# Patient Record
Sex: Female | Born: 2000 | Hispanic: Yes | Marital: Single | Smoking: Never smoker
Health system: Southern US, Community
[De-identification: ages and names within clinical notes are randomized; demographics above are authoritative.]

## PROBLEM LIST (undated history)

## (undated) DIAGNOSIS — J45909 Unspecified asthma, uncomplicated: Secondary | ICD-10-CM

---
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
## 2019-04-18 DIAGNOSIS — F419 Anxiety disorder, unspecified: Secondary | ICD-10-CM

## 2019-04-19 ENCOUNTER — Other Ambulatory Visit: Payer: Self-pay

## 2019-04-19 ENCOUNTER — Ambulatory Visit (LOCAL_COMMUNITY_HEALTH_CENTER): Payer: Self-pay | Admitting: Advanced Practice Midwife

## 2019-04-19 ENCOUNTER — Encounter: Payer: Self-pay | Admitting: Advanced Practice Midwife

## 2019-04-19 VITALS — BP 99/63 | Ht 60.0 in | Wt 149.0 lb

## 2019-04-19 DIAGNOSIS — Z3009 Encounter for other general counseling and advice on contraception: Secondary | ICD-10-CM

## 2019-04-19 DIAGNOSIS — Z30013 Encounter for initial prescription of injectable contraceptive: Secondary | ICD-10-CM

## 2019-04-19 LAB — HEMOGLOBIN, FINGERSTICK: Hemoglobin: 12.6 g/dL (ref 11.1–15.9)

## 2019-04-19 LAB — WET PREP FOR TRICH, YEAST, CLUE
Trichomonas Exam: NEGATIVE
Yeast Exam: NEGATIVE

## 2019-04-19 NOTE — Progress Notes (Signed)
   [place] problem visit  [place] Department  Subjective:  Colleen Gomez is a 18 y.o. HF G1P1  being seen today for menses 2x/mo with heavy bleeding (7-8 super pads/day) since Nexplanon removed   Chief Complaint  Patient presents with  . Menstrual Problem    heavy periods twice monthly    HPI  18 yo HF G104fP1 c/o heavy menses 2x/mo since Nexplanon removed 12/2017.  Began ocp's and only took for 2 months and self dc'd end of September due to no relief and c/o daily nausea, dizziness, and chills and sweating every night.  Onset menarche age 51 with heavy menses first 4 months and then normal.  No bleeding with Nexplanon.  Doesn't want Nexplanon again or ocp's.  Wants to try DMPA to stop bleeding because doesn't have Medicaid/insurance so doesn't want to go to GYN for this.  38 yo boyfriend of 2 years has vasectomy.  Last sex 04/17/19 without condom.  No bleeding today. Does the patient have a current or past history of drug use? No   No components found for: HCV]   Health Maintenance Due  Topic Date Due  . CHLAMYDIA SCREENING  08/16/2014  . HIV Screening  08/16/2014  . TETANUS/TDAP  08/16/2018  . INFLUENZA VACCINE  01/12/2019    ROS  The following portions of the patient's history were reviewed and updated as appropriate: allergies, current medications, past family history, past medical history, past social history, past surgical history and problem list. Problem list updated.   See flowsheet for other program required questions.  Objective:   Vitals:   04/19/19 1033  BP: 99/63  Weight: 149 lb (67.6 kg)  Height: 5' (1.524 m)    Physical Exam Constitutional:      Appearance: Normal appearance. She is normal weight.  Eyes:     Conjunctiva/sclera: Conjunctivae normal.  Abdominal:     Palpations: Abdomen is soft.  Genitourinary:    General: Normal vulva.     Exam position: Lithotomy position.     Labia:        Right: No  lesion.        Left: No lesion.      Vagina: Vaginal discharge (small amt white creamy leukorrhea, ph<4.5) present.     Cervix: Normal.     Uterus: Normal.      Adnexa: Right adnexa normal and left adnexa normal.     Rectum: Normal.  Skin:    General: Skin is warm and dry.  Neurological:     Mental Status: She is alert.       Assessment and Plan:  Colleen Gomez is a 18 y.o. female presenting to the [facility] Department for a Women's Health problem visit  1. Family planning To check Hgb for symptomatic possible anemia Pt declines GYN referral today and wants to try DMPA first - Hemoglobin, venipuncture - WET PREP FOR [place], YEAST, [place]  2. Encounter for initial prescription of injectable contraceptive DMPA 150 mg IM q 11-13 wks x 3 Please counsel on need for abstinance/back up condoms next  7 days (states boyfriend had vasectomy) If bleeding still heavy after 3 DMPA then needs GYN referral  When RN went in to give DMPA, pt states she changed her mind and doesn't want it but will go to GYN for consult   Return for 11-13 wk DMPA.  No future appointments.  Herbie Saxon, CNM

## 2019-04-19 NOTE — Progress Notes (Signed)
Hgb and wet mount reviewed, no tx per standing order. Pt states she changed her mind and declines DMPA today. Pt states she will schedule appt with OBGYN for evaluation of bleeding issue. Declined condoms. Provider orders addressed.

## 2019-04-19 NOTE — Progress Notes (Signed)
Pt here as she is having issues with her periods. Reports this has been going on for a year now ever since she had the Nexplanon removed. Reports she is having period twice a month that are heavy. Reports going through 7 or 8 super pads a day. Pt also reports nausea, chills, sweating at night, and dizziness. Pt reports she is not currently on birth control, pt reports she stopped taking birth control pills around the end of Sept., beginning of Oct. 2020 as they did not help regulate her periods.Ronny Bacon, RN

## 2019-06-14 NOTE — L&D Delivery Note (Signed)
-------------------------------------------------------------------------------   Attestation with edits by Zahn, Katelin Marie, MD at 06/03/20 0236 I was present for this uncomplicated vaginal delivery.  Katelin Zahn, MD Division of General Obstetrics and Gynecology  -------------------------------------------------------------------------------  OBSTETRICS DELIVERY NOTE   Pre-Operative Diagnosis: 1. [redacted]w[redacted]d pregnancy 2. IDA 3. Asthma   Post-Operative Diagnosis: Same; delivered  Delivery Type: Vaginal, Spontaneous  Delivery Clinician: MARLAND KALATA  Delivery Anesthesia: Epidural  Labor Complications: None   Labor Course: Patient was admitted for Spontaneous labor with normal labor course. She delivered NSVD.  Delivery Note: The OB team was called to see the patient because she was complete and pushing.  The infant's head was delivered. There was no nuchal cord. The shoulders and body followed with ease. There was no meconium present. Cord clamping was delayed 30 seconds, and then the cord was clamped and cut. The infant was placed on the maternal abdomen. The placenta was delivered as described below. The fundus was firm with IV pitocin and fundal massage.   The sponge, lap, and needle counts were correct and verified with the nurse in attendance.  Perineal Lacerations: None  Other Lacerations: none  Quantitative Blood Loss:    Findings: 1) female infant, Apgar scores of    at 1 minute and    at 5 minutes and birthweight pending per protocol   Placenta: Appearance: Intact  Removal: Spontaneous    Disposition: Discarded   Blood gases sent? no  Delivery Complications: None  Neonatal Disposition:  Infant to Maternal skin-to-skin.   Pregnancy risk: The risk of the pregnancy was high.  Attending Attestation: On arrival to the room, the patient had already delivered the infant.  I was present afterwards.

## 2020-02-04 ENCOUNTER — Encounter: Payer: Self-pay | Admitting: Emergency Medicine

## 2020-02-04 ENCOUNTER — Other Ambulatory Visit: Payer: Self-pay

## 2020-02-04 DIAGNOSIS — O26892 Other specified pregnancy related conditions, second trimester: Secondary | ICD-10-CM | POA: Insufficient documentation

## 2020-02-04 DIAGNOSIS — R0602 Shortness of breath: Secondary | ICD-10-CM | POA: Insufficient documentation

## 2020-02-04 DIAGNOSIS — Z3A22 22 weeks gestation of pregnancy: Secondary | ICD-10-CM | POA: Insufficient documentation

## 2020-02-04 DIAGNOSIS — Z5321 Procedure and treatment not carried out due to patient leaving prior to being seen by health care provider: Secondary | ICD-10-CM | POA: Insufficient documentation

## 2020-02-04 DIAGNOSIS — R0789 Other chest pain: Secondary | ICD-10-CM | POA: Insufficient documentation

## 2020-02-04 LAB — CBC WITH DIFFERENTIAL/PLATELET
Abs Immature Granulocytes: 0.06 10*3/uL (ref 0.00–0.07)
Basophils Absolute: 0.1 10*3/uL (ref 0.0–0.1)
Basophils Relative: 1 %
Eosinophils Absolute: 4.4 10*3/uL — ABNORMAL HIGH (ref 0.0–0.5)
Eosinophils Relative: 33 %
HCT: 31.9 % — ABNORMAL LOW (ref 36.0–46.0)
Hemoglobin: 10.7 g/dL — ABNORMAL LOW (ref 12.0–15.0)
Immature Granulocytes: 0 %
Lymphocytes Relative: 16 %
Lymphs Abs: 2.2 10*3/uL (ref 0.7–4.0)
MCH: 28.5 pg (ref 26.0–34.0)
MCHC: 33.5 g/dL (ref 30.0–36.0)
MCV: 85.1 fL (ref 80.0–100.0)
Monocytes Absolute: 0.6 10*3/uL (ref 0.1–1.0)
Monocytes Relative: 4 %
Neutro Abs: 6.1 10*3/uL (ref 1.7–7.7)
Neutrophils Relative %: 46 %
Platelets: 257 10*3/uL (ref 150–400)
RBC: 3.75 MIL/uL — ABNORMAL LOW (ref 3.87–5.11)
RDW: 13 % (ref 11.5–15.5)
Smear Review: NORMAL
WBC: 13.4 10*3/uL — ABNORMAL HIGH (ref 4.0–10.5)
nRBC: 0 % (ref 0.0–0.2)

## 2020-02-04 LAB — COMPREHENSIVE METABOLIC PANEL
ALT: 10 U/L (ref 0–44)
AST: 17 U/L (ref 15–41)
Albumin: 3.5 g/dL (ref 3.5–5.0)
Alkaline Phosphatase: 58 U/L (ref 38–126)
Anion gap: 7 (ref 5–15)
BUN: <5 mg/dL — ABNORMAL LOW (ref 6–20)
CO2: 27 mmol/L (ref 22–32)
Calcium: 8.9 mg/dL (ref 8.9–10.3)
Chloride: 104 mmol/L (ref 98–111)
Creatinine, Ser: 0.37 mg/dL — ABNORMAL LOW (ref 0.44–1.00)
GFR calc Af Amer: >60 mL/min (ref >60)
GFR calc non Af Amer: >60 mL/min (ref >60)
Glucose, Bld: 93 mg/dL (ref 70–99)
Potassium: 3.4 mmol/L — ABNORMAL LOW (ref 3.5–5.1)
Sodium: 138 mmol/L (ref 135–145)
Total Bilirubin: 0.6 mg/dL (ref 0.3–1.2)
Total Protein: 7.2 g/dL (ref 6.5–8.1)

## 2020-02-04 LAB — TROPONIN I (HIGH SENSITIVITY): Troponin I (High Sensitivity): <2 ng/L (ref <18)

## 2020-02-04 NOTE — ED Triage Notes (Signed)
Patient ambulatory to triage with steady gait, without difficulty or distress noted; G2P1, approx [redacted]wks pregnant, pt denies any pregnancy complications; st x 4 days having SHOB and mid sternal CP; pt at [facility]

## 2020-02-04 NOTE — ED Notes (Addendum)
FHTs via doppler to left lower quad at 140 and regular; discussed pt cc with Dr Derrill Kay, no further protocols at this time

## 2020-02-05 ENCOUNTER — Emergency Department
Admission: EM | Admit: 2020-02-05 | Discharge: 2020-02-05 | Disposition: A | Discharge status: Home / Self Care | Payer: Self-pay | Attending: Emergency Medicine | Admitting: Emergency Medicine

## 2020-02-05 LAB — PATHOLOGIST SMEAR REVIEW

## 2020-04-05 ENCOUNTER — Other Ambulatory Visit: Payer: Self-pay

## 2020-04-05 ENCOUNTER — Observation Stay
Admission: EM | Admit: 2020-04-05 | Discharge: 2020-04-05 | Disposition: A | Discharge status: Home / Self Care | Payer: Self-pay | Attending: Obstetrics and Gynecology | Admitting: Obstetrics and Gynecology

## 2020-04-05 DIAGNOSIS — Z3A31 31 weeks gestation of pregnancy: Secondary | ICD-10-CM | POA: Insufficient documentation
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
## 2020-04-05 NOTE — Discharge Instructions (Signed)
Keep regularly scheduled appointment. Return to hospital for contractions, leaking of fluid like your water broke, vaginal bleeding like a period, or if your baby is not moving like normal. May use tylenol as needed for pain following package instructions. Recommend pregnancy support band for lower back pain/ vaginal pressure.

## 2020-04-05 NOTE — Progress Notes (Signed)
Patient reports lower back pain has gone away and she has not felt a contraction since she was on the elevator on her way up to L&D. Patient reports she feels safe to go home. She states she is still have vaginal pressure, but has had this for the past 15 days and has spoken with her Dr. About it. Discussed cervical exam. Patient requests for cervix to be checked prior to being discharged home.

## 2020-04-05 NOTE — OB Triage Note (Signed)
Patient discharged in stable condition via wheelchair accompanied by RN and significant other.
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
## 2020-05-14 ENCOUNTER — Observation Stay
Admission: EM | Admit: 2020-05-14 | Discharge: 2020-05-14 | Disposition: A | Discharge status: Home / Self Care | Payer: Self-pay | Attending: Obstetrics and Gynecology | Admitting: Obstetrics and Gynecology

## 2020-05-14 ENCOUNTER — Encounter: Payer: Self-pay | Admitting: Obstetrics and Gynecology

## 2020-05-14 ENCOUNTER — Other Ambulatory Visit: Payer: Self-pay

## 2020-05-14 DIAGNOSIS — O99891 Other specified diseases and conditions complicating pregnancy: Secondary | ICD-10-CM

## 2020-05-14 DIAGNOSIS — J45909 Unspecified asthma, uncomplicated: Secondary | ICD-10-CM | POA: Insufficient documentation

## 2020-05-14 DIAGNOSIS — M549 Dorsalgia, unspecified: Secondary | ICD-10-CM | POA: Diagnosis present

## 2020-05-14 DIAGNOSIS — O4700 False labor before 37 completed weeks of gestation, unspecified trimester: Secondary | ICD-10-CM | POA: Insufficient documentation

## 2020-05-14 DIAGNOSIS — Z3A36 36 weeks gestation of pregnancy: Secondary | ICD-10-CM | POA: Insufficient documentation

## 2020-05-14 DIAGNOSIS — O99892 Other specified diseases and conditions complicating childbirth: Principal | ICD-10-CM | POA: Insufficient documentation

## 2020-05-14 DIAGNOSIS — O99513 Diseases of the respiratory system complicating pregnancy, third trimester: Secondary | ICD-10-CM | POA: Insufficient documentation

## 2020-05-14 HISTORY — DX: Other specified diseases and conditions complicating pregnancy: O99.891

## 2020-05-14 LAB — URINALYSIS, ROUTINE W REFLEX MICROSCOPIC
Bilirubin Urine: NEGATIVE
Glucose, UA: NEGATIVE mg/dL
Hgb urine dipstick: NEGATIVE
Ketones, ur: NEGATIVE mg/dL
Leukocytes,Ua: NEGATIVE
Nitrite: NEGATIVE
Protein, ur: NEGATIVE mg/dL
Specific Gravity, Urine: 1.009 (ref 1.005–1.030)
pH: 7 (ref 5.0–8.0)

## 2020-05-14 MED ORDER — ACETAMINOPHEN 500 MG PO TABS
ORAL_TABLET | ORAL | Status: AC
  Filled 2020-05-14: qty 2

## 2020-05-14 MED ORDER — ACETAMINOPHEN 325 MG PO TABS: 650.0000 mg | ORAL_TABLET | Freq: Four times a day (QID) | ORAL | Status: DC | PRN

## 2020-05-14 MED ORDER — ACETAMINOPHEN 500 MG PO TABS
1000.0000 mg | ORAL_TABLET | Freq: Four times a day (QID) | ORAL | Status: DC | PRN
  Administered 2020-05-14: 1000 mg via ORAL

## 2020-05-14 NOTE — Discharge Instructions (Signed)

## 2020-05-14 NOTE — Discharge Summary (Signed)
Colleen Gomez is a 19 y.o. female. She is at [redacted]w[redacted]d gestation. Patient's last menstrual period was 04/12/2019. Estimated Date of Delivery: 06/09/20  Prenatal care site: [facility]  Current pregnancy complicated by:    Chief complaint: low back pain and cramping all day.   Location: low back Onset/timing: 12/2 at 0600 Aggravating or alleviating conditions: has not tried tylenol or PO hydration.    S: Resting comfortably. no VB.no LOF,  Active fetal movement. Denies: HA, visual changes, SOB, or RUQ/epigastric pain  Maternal Medical History:   Past Medical History:  Diagnosis Date  . Asthma     History reviewed. No pertinent surgical history.  No Known Allergies  Prior to Admission medications   Medication Sig Start Date End Date Taking? Authorizing Provider  albuterol (PROVENTIL HFA;VENTOLIN HFA) 108 (90 Base) MCG/ACT inhaler Inhale 2 puffs into the lungs every 6 (six) hours as needed for wheezing or shortness of breath. 04/18/18  Yes Joni Reining, PA-C  Prenatal Vit-Fe Fumarate-FA (MULTIVITAMIN-PRENATAL) 27-0.8 MG TABS tablet Take 1 tablet by mouth daily at 12 noon.   Yes [provider]      Social History: She  reports that she has never smoked. She has never used smokeless tobacco. She reports that she does not drink alcohol and does not use drugs.  Family History:  no history of gyn cancers  Review of Systems: A full review of systems was performed and negative except as noted in the HPI.     O:  BP 101/64 (BP Location: Right Arm)   Pulse 93   Temp 98.4 F (36.9 C) (Oral)   Resp 16   LMP 04/12/2019 Comment: heavy No results found for this or any previous visit (from the past 48 hour(s)).   Constitutional: NAD, AAOx3  HE/ENT: extraocular movements grossly intact, moist mucous membranes CV: RRR PULM: nl respiratory effort, CTABL     Abd: gravid, non-tender, non-distended, soft      Ext: Non-tender,  Nonedematous   Psych: mood appropriate, speech normal  Pelvic: Dilation: 1 Cervical Position: Posterior Exam by:: Raniya Golembeski CNM   Fetal  monitoring: Cat I Appropriate for GA Baseline: 130bpm Variability: moderate Accelerations:  present x >2 Decelerations absent Time    A/P: 19 y.o. [redacted]w[redacted]d here for antenatal surveillance for back pain and irreg contractions in pregnancy  Principle Diagnosis:  High risk pregnancy in third trimester   Labor: not present.   Fetal Wellbeing: Reassuring Cat 1 tracing.  Reactive NST   Comfort measures for back pain given   D/c home stable, precautions reviewed, follow-up as scheduled.    Randa Ngo, CNM 05/14/2020  9:44 PM

## 2020-05-14 NOTE — OB Triage Note (Addendum)
Pt. reports to Labor & Delivery for complaints of contractions every 15-20 minutes since 0600 this morning. She rates the contractions 6/10, with the pain mainly in her back. She denies LOF and vaginal bleeding and reports good fetal movement. Vital signs WNL. External FHR and Toco applied. Will continue to assess.  ED originally reported that pt. receives care from [facility]. Upon speaking with the pt., it appears that she actually receives care from [facility]. Valentino Saxon, MD, who was originally contacted with report on this pt., was recontacted to let her know that this pt. will be cared for by [facility]. McVey, CNM, given report on pt. at [facility].

## 2020-05-14 NOTE — OB Triage Note (Signed)
Pt discharged home in stable condition. RN provided discharge instructions to pt. Pt verbalized understanding.

## 2020-06-03 NOTE — Discharge Summary (Signed)
 Obstetrics Discharge Summary  Admit Date: 06/02/2020  Discharge Date: 06/04/2020   Discharge To: Home  Discharge Attending Provider: Curtiss Ice NP  Delivery Type: Vaginal, Spontaneous   Gestational Age at Delivery:  Information for the patient's newborn:  Kahlia, Lagunes [[id number]]  Gestational Age: [redacted]w[redacted]d    Delivery Clinician: MARLAND KALATA   Delivery Anesthesia: Epidural   Labor Complications: None   Lacerations: None   Other Procedures: None   Postpartum Complications: None  Hospital Problems Addressed During this Hospitalization: Active Problems:   * No active hospital problems. *   Birth Control Planned at Discharge: Plans Nexplanon at [facility] visit  Infant Feeding Method at Discharge: L&D Feeding Options: breast  Hospital Course:  Colleen Gomez is a 19 y.o. [id number] admitted on 06/02/2020 to labor and delivery for spontaneous labor. Labor was uncomplicated. She had a spontaneous vaginal delivery at [redacted]w[redacted]d on 06/03/2020. Delivery was uncomplicated. Her pregnancy and postpartum courses were complicated by the pertinent problems below.   Iron deficiency anemia  Patient had an allergic reaction to iron dextran, so she had a ferric gluconate infusion on 05/20/20. She was unable to stay for the complete course (4 doses), so she only received one dose prior to discharge at that time. Her H/H on this admission was 10.9/35 (increased from 8.6/27.1 on 12/8). Since she was not severely anemic with minimal QBL (total 555 ml), asymptomatic, and hemodynamically stable, additional doses of IV iron were deferred postpartum. She was advised to continue an oral iron supplement postpartum, which she states she is able to tolerate occasionally.  During the postpartum period she met all of her postpartum goals including pain control with oral medications, tolerating a regular diet, ambulating well, and voiding spontaneously. She was up to date on vaccinations  (Rubella, Varicella, Tdap, Flu, and Covid). She was discharged home on PPD#1 in stable condition. She was counseled on routine postpartum care and to follow-up with her OB [facility]) in 4-6 weeks for routine postpartum care.   Day of Discharge Services: Patient was seen and examined by the primary team and deemed stable for discharge.  BP 88/42   Pulse 94   Temp 36.4 C (97.6 F) (Oral)   Resp 17   Ht 154.9 cm (5' 1)   LMP 09/03/2019   SpO2 98%   Breastfeeding Yes   BMI 28.62 kg/m  General: No acute distress Respiratory: Normal work of breathing Abdomen: soft, non-distended, fundus firm below the umbilicus, nontender to palpation GU: minimal vaginal bleeding Extremities: no calf pain bilaterally, no asymmetric edema  Condition at Discharge: good  [id number]: I personally spent less than 30 minutes in discharge planning services  Pertinent Labs: HELLP  No results found for requested labs within last 2 days.  [  CBC  Results in Past 2 Days Result Component Current Result  WBC 13.7 (H) (06/02/2020)  RBC 4.56 (06/02/2020)  HCT 35.0 (L) (06/02/2020)  HGB 10.9 (L) (06/02/2020)  Platelet 300 (06/02/2020)  MCV 76.7 (L) (06/02/2020)  MCH 24.0 (L) (06/02/2020)  MCHC 31.3 (06/02/2020)  RDW 19.2 (H) (06/02/2020)    Lab Results  Component Value Date   WBC 13.7 (H) 06/02/2020   HGB 10.9 (L) 06/02/2020   HCT 35.0 (L) 06/02/2020   PLT 300 06/02/2020    Invalid input(s): POCTBG  Pending Test Results:    Discharge Medications:    Your Medication List    CONTINUE taking these medications   albuterol  90 mcg/actuation inhaler Commonly known as: PROVENTIL   HFA;VENTOLIN  HFA Inhale 2 puffs.   albuterol  90 mcg/actuation inhaler Commonly known as: PROVENTIL  HFA;VENTOLIN  HFA Inhale 2 puffs every four (4) hours as needed for wheezing or shortness of breath.   ferrous sulfate 325 (65 FE) MG EC tablet Take 325 mg by mouth Three (3) times a day with a meal.   multivitamin,  prenatal (folic acid-iron) 27-1 mg Tab Take 1 tablet by mouth daily.        Immunizations: Immunization History  Administered Date(s) Administered  . COVID-19 VACCINE,MRNA(MODERNA)(PF)(IM) 03/24/2020, 04/21/2020  . Influenza Virus Vaccine, unspecified formulation 03/24/2020  . TdaP 04/21/2020    Discharge Instructions: Activity Instructions    Activity as tolerated       Diet Instructions    Discharge diet (specify)     Discharge Nutrition Therapy: Regular     Other Instructions    Additional follow-up (specify)     You need a postpartum visit in the next 4-6 weeks. Please call your OB clinic to schedule this.   Call MD for:  difficulty breathing, headache or visual disturbances     Call MD for:  extreme fatigue     Call MD for:  hives     Call MD for:  persistent dizziness or light-headedness     Call MD for:  persistent nausea or vomiting     Call MD for:  severe uncontrolled pain     Call MD for:  temperature >38.5 Celsius     Call MD for:  vaginal bleeding saturating more than 1 pad per hour.     Discharge instructions     Ibuprofen or Tylenol  for pain as needed. Ibuprofen:  600 mg every 6 hrs or 800 mg every 8 hours as needed.Tylenol  650 mg every 6 hours as needed.   Stool softener: Colace (docusate sodium) 100 mg twice daily and/or Miralax once or twice daily until having regular bowel movements.   Anemia: As we discussed, I recommend that you take the iron supplement Slow Fe daily. It is best to take on an empty stomach, but that can cause stomach upset. It is ok to take with food if you need to. Citrus foods/drinks (orange juice, etc) can help with the absorption of the medication. This medication can also cause constipation, so I recommend taking it with the stool softeners above.  Birth control: For more information about all of your options, I recommend the website www.bedsider.org  Perineal Care: Tucks pads on sanitary napkin continuously x 7 days. Sitz  baths daily with warm water and epsom salts daily x 7 days. Dibucaine ointment to affected area four times a day (if you received during your hospital stay or was given a prescription). Dermaplast spray or Proctofoam (buy over the counter) to affected area throughout day.  Please page the OB MD on call at  [phone] and ask for OB doctor on call for any of the following:  - Vaginal bleeding that is soaking through one or more maxi pads in an hour  - Fever of 100.10F or higher  - Overwhelming feelings of sadness or anxiety that are not relieved with rest  - Foul-smelling discharge from vagina  - Pain in legs that makes it difficult to walk  - Very bad headache that is not relieved with motrin or tylenol   - Abdominal pain that is not relieved with rest and ibuprofen - Signs of a breast infection: red streaks or redness on the breast, fever and/or muscle aches.  - Pain with  urination   What Can I Expect in the First Few Months After the Baby Comes?  New parents and their families face many challenges in the first few months: Your body and your hormones have to get back to normal. You and the baby may be learning to breastfeed. Babies only sleep a few hours at a time. The whole new family will have a hard time getting enough sleep. You and your family need to learn how to parent this new family member. You may have to figure out how to keep from getting pregnant again right away. You may need to return to work and find day care. If you have a partner, you have to figure out how to take time together as a couple. Share the experience. Spend time alone. Even a 30-minute walk can be a date. You can become pregnant even before you have your first menses. Be sure to use contraception if you start having intercourse.   How Long Will It Take for My Body to Get Back to Normal?  Some changes occur quickly. Others will not happen so quickly. Your uterus, cervix and vagina will all shrink to their normal,  nonpregnant size in about 2 weeks. Your vagina may be tender and dry for a few months-especially if you are breastfeeding. If you had stitches or hemorrhoids, your ''bottom'' will be sore for 2 weeks or more. For some women who have problems urinating, it can take several months for you to be able to hold your urine when you cough or sneeze or suddenly pick up something heavy. Your breast milk will ''come in'' 2 to 3 days after the birth of your baby. It will take 6 to 8 weeks for you and the baby to get the hang of breastfeeding and find a pattern. During these first weeks, you can have engorged breasts at times and often leak milk. Your stomach and intestines have to go back into place. You may have a lot of gas for a few weeks. You may be constipated. Your stretched stomach muscles can recover in a few weeks, but for some women it takes longer (6 months to  1 year) to recover. Losing the weight you gained during pregnancy will probably take 6 months to a year.   Exercise: If you are interested in a postpartum exercise program, I recommend www.thebloommethod.com, PhoneCaptions.ch, www.expectingandempowered.com   What Can I Expect When My Hormones Change?  About 75% of all women will get the ''blues.'' This usually starts about 3 days after the birth of your baby. You may cry easily and feel very, very tired. A few women become very depressed. Call the office right away if you cannot care for yourself or your baby, if you feel very nervous or worried, if you cannot stop crying, or if you are having thoughts of hurting yourself or your baby.   Taking Care of Yourself  Ask for help. Let other people do the cooking and cleaning and run the house. Focus on yourself and your baby. Sleep whenever you can. Try not to be tempted to ''get some things done'' when the baby sleeps. This is your time to sleep, too. Drink lots of water. You will need at least 6 big glasses of water every day to avoid constipation  and make enough breast milk. Every time you sit down to breastfeed, have a big glass of water with you to drink while you are nursing. Eat lots of vegetables and fruit. You will need lots  of vitamins and fiber to help your body get back to normal. This will also help you avoid constipation. Go outside and walk. Babies can go outside even if it is very cold. Fresh air and sunshine will do you both good. You may want to do a sitz baths. Put about 6 inches of warm water in your bathtub and sit in there for 15 minutes 2 or 3 times a day. This will help your ''bottom'' heal more quickly. It will also give you 15 minutes of private time! Nothing should be  placed in the vagina for 6 weeks: no intercourse, tampons or douching. Talk to other parents. Join a new parents group. Call [facility] and go to their meetings if you are breastfeeding.        Scribe's Attestation: Curtiss Ice NP obtained and performed the history, physical exam and medical decision making elements that were entered into the chart. Documentation assistance was provided by me personally, a scribe. Signed by Fredia Boy, Scribe, on June 03, 2020 at 9:24 AM.     ---------------------------------------------------------------------------------------------------------------------- June 04, 2020 8:04 AM. Documentation assistance provided by the Scribe. I was present during the time the encounter was recorded. The information recorded by the Scribe was done at my direction and has been reviewed and validated by me. ----------------------------------------------------------------------------------------------------------------------

## 2023-12-22 ENCOUNTER — Ambulatory Visit: Payer: Self-pay

## 2023-12-26 ENCOUNTER — Ambulatory Visit: Payer: Self-pay | Admitting: Allergy & Immunology

## 2024-02-20 ENCOUNTER — Encounter (HOSPITAL_COMMUNITY): Payer: Self-pay

## 2024-02-20 ENCOUNTER — Emergency Department (HOSPITAL_COMMUNITY)

## 2024-02-20 ENCOUNTER — Emergency Department (HOSPITAL_COMMUNITY)
Admission: EM | Admit: 2024-02-20 | Discharge: 2024-02-20 | Discharge status: Against Medical Advice | Attending: Physician Assistant | Admitting: Physician Assistant

## 2024-02-20 ENCOUNTER — Other Ambulatory Visit: Payer: Self-pay

## 2024-02-20 DIAGNOSIS — Y92481 Parking lot as the place of occurrence of the external cause: Secondary | ICD-10-CM | POA: Insufficient documentation

## 2024-02-20 DIAGNOSIS — M79605 Pain in left leg: Secondary | ICD-10-CM | POA: Diagnosis not present

## 2024-02-20 DIAGNOSIS — M7918 Myalgia, other site: Secondary | ICD-10-CM | POA: Insufficient documentation

## 2024-02-20 DIAGNOSIS — R519 Headache, unspecified: Secondary | ICD-10-CM | POA: Insufficient documentation

## 2024-02-20 DIAGNOSIS — M79602 Pain in left arm: Secondary | ICD-10-CM | POA: Insufficient documentation

## 2024-02-20 DIAGNOSIS — Z5321 Procedure and treatment not carried out due to patient leaving prior to being seen by health care provider: Secondary | ICD-10-CM | POA: Insufficient documentation

## 2024-02-20 DIAGNOSIS — J3489 Other specified disorders of nose and nasal sinuses: Secondary | ICD-10-CM | POA: Insufficient documentation

## 2024-02-20 DIAGNOSIS — M25552 Pain in left hip: Secondary | ICD-10-CM | POA: Diagnosis not present
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
## 2024-02-20 NOTE — ED Notes (Signed)
 Pt called for vitals re-check x2, no response.

## 2024-06-03 ENCOUNTER — Ambulatory Visit (LOCAL_COMMUNITY_HEALTH_CENTER): Payer: Self-pay

## 2024-06-03 ENCOUNTER — Ambulatory Visit: Payer: Self-pay

## 2024-06-03 VITALS — BP 90/62 | Ht <= 58 in | Wt 156.0 lb

## 2024-06-03 DIAGNOSIS — Z3046 Encounter for surveillance of implantable subdermal contraceptive: Secondary | ICD-10-CM

## 2024-06-03 DIAGNOSIS — Z3009 Encounter for other general counseling and advice on contraception: Secondary | ICD-10-CM

## 2024-06-03 MED ADMIN — Etonogestrel Subdermal Implant 68 MG: 68 mg | SUBCUTANEOUS | NDC 78206014501

## 2024-06-03 NOTE — Progress Notes (Signed)
 FP education card given and reviewed with pt. Work note given to cover today as she states she works in [place] and had to take the whole morning off.  Immunizations reviewed and provided to pt. Larraine JONELLE Novak, RN

## 2024-06-03 NOTE — Progress Notes (Signed)
 " [facility] [address], [address] [place] [address] Main phone: [phone]  Family Planning Visit - Initial Visit  Subjective:  Colleen Gomez is a 23 y.o.  [id number]   being seen today for an initial annual visit and to discuss reproductive life planning.  The patient is currently using hormonal implant for pregnancy prevention. Patient does not want a pregnancy in the next year.   Patient reports they desire Nexplanon  removal and replacement (placed Jan 2022).  Patient has the following medical conditions: There are no active problems to display for this patient.   Chief Complaint  Patient presents with   Annual Exam    Nexplanon  removal/reinsert    HPI Patient reports desire for Nexplanon  removal/replacement. She likes her Nexplanon , this will be her third device. She is overdue a pap test, has never had one. Declines STI testing today.  Patient denies other concerns today.  Review of Systems  All other systems reviewed and are negative.  Diabetes screening This patient is 23 y.o. with a BMI of Body mass index is 32.6 kg/m.SABRA  Is patient eligible for diabetes screening (age >35 and BMI >25)?  no  Was Hgb A1c ordered? no  STI screening Patient reports 2 partners in last year.  Does this patient desire STI screening?  No - declines  Cervical Cancer Screening  Due for pap, however declines today  Health Maintenance Due  Topic Date Due   HPV VACCINES (1 - 3-dose series) Never done   Hepatitis C Screening  Never done   DTaP/Tdap/Td (1 - Tdap) Never done   Hepatitis B Vaccines 19-59 Average Risk (1 of 3 - 19+ 3-dose series) Never done   CHLAMYDIA SCREENING  04/23/2019   Cervical Cancer Screening (Pap smear)  Never done   Influenza Vaccine  01/12/2024   COVID-19 Vaccine (3 - 2025-26 season) 02/12/2024    The following portions of the patient's history were reviewed and updated as  appropriate: allergies, current medications, past family history, past medical history, past social history, past surgical history and problem list. Problem list updated.  See flowsheet for further details and programmatic requirements Hyperlink available at the top of the signed note in blue.  Flow sheet content below:  Pregnancy Intention Screening Does the patient want to become pregnant in the next year?: No Does the patient's partner want to become pregnant in the next year?: No Would the patient like to discuss contraceptive options today?: Yes Other:  Is it okay to contact you by mail?: Yes Difficulty accessing hygiene products (feminine products/soap) in the last 3 months: No Contraception History Past methods of contraception used by patient:: Hormonal Implant Adverse effects associated with Hormonal Implant: none Sexual History What age did you start your period?:  (8) How often do you have your period?: monthly Date of last sex?: 06/01/24 Has the patient had unprotected sex within the last 5 days?: Yes Do you have sex with men, women, both men and women?: Men only In the past 2 months how many partners have you had sex with?: 1 In the past 12 months, how many partners have you had sex with?: 2 Is it possible that any of your sex partners in the past 12 months had sex with someone else whild they were still in a sexual relationship with you?: No What ways do you have sex?: Vaginal Do you or your partner use condoms and/or dental dams every time you have vaginal,  oral or anal sex?: No Do you douche?: No Date of last HIV test?: 04/24/18 Have you ever had an STD?: No Have any of your partners had an STD?: No Have you or your partner ever shot up drugs?: No Have any of your partners used drugs in the past?: No Have you or your partners exchanged money or drugs for sex?: No Counseling All Patients: LARCS discussed Education: Make informed decision about family planning,  Provided preconception counseling, Reduce risk of transmission and protection from STD's and HIV, Understand BMI >25 or >18.5 is a health risk (weight management educational materials to be provided to client requests), Promoted daily consumption of MVI with folic acid if capable of conceiving., Review immunization history, inform client of recommended vaccines per CDC's ACIP Guidelines and refer to Immunization clinic, Results of physical assessment and labs (if performed), How to discontinue the method selected and information on back up method used, How to use the method selected and information on back up method used, How to use the method consistently and correctly, Teach back method completed, Warning signs for rare but serious adverse events and what to do if they experience a warning sign (including emergency 24 hour number, where to seek emergency service outside of hours of operation), When to return for follow up (planned return schedule), PCP list given to patient Contraception Wrap Up Current Method: Hormonal Implant End Method: Hormonal Implant Contraception Counseling Provided: Yes How was the end contraceptive method provided?: Provided on site  Objective:   Vitals:   06/03/24 0851  BP: 90/62  Weight: 156 lb (70.8 kg)  Height: 4' 10 (1.473 m)   Physical Exam Vitals and nursing note reviewed.  Constitutional:      Appearance: Normal appearance.  HENT:     Head: Normocephalic and atraumatic.     Comments: No nits or hair loss on scalp, brows, and lashes    Mouth/Throat:     Mouth: Mucous membranes are moist.     Pharynx: Oropharynx is clear. No oropharyngeal exudate or posterior oropharyngeal erythema.  Eyes:     General:        Right eye: No discharge.        Left eye: No discharge.     Conjunctiva/sclera: Conjunctivae normal.     Right eye: Right conjunctiva is not injected.     Left eye: Left conjunctiva is not injected.  Cardiovascular:     Heart sounds: Normal  heart sounds, S1 normal and S2 normal.  Pulmonary:     Effort: Pulmonary effort is normal.     Breath sounds: Normal breath sounds.  Abdominal:     Tenderness: There is no abdominal tenderness. There is no rebound.  Genitourinary:    Comments: Politely declined genital exam. Musculoskeletal:     Right lower leg: No edema.     Left lower leg: No edema.  Lymphadenopathy:     Cervical: No cervical adenopathy.     Upper Body:     Right upper body: No supraclavicular, axillary or epitrochlear adenopathy.     Left upper body: No supraclavicular, axillary or epitrochlear adenopathy.     Comments: Patient declines pelvic exam, inguinal lymph nodes not evaluated.  Skin:    General: Skin is warm and dry.     Findings: No lesion or rash.  Neurological:     Mental Status: She is alert and oriented to person, place, and time.  Psychiatric:        Mood and Affect: Mood normal.  Assessment and Plan:  Colleen Gomez is a 23 y.o. female presenting to the [facility] Department for an initial annual wellness/contraceptive visit  Family planning  Contraception counseling:  Reviewed options based on patient desire and reproductive life plan. Patient is interested in Hormonal Implant. This was provided to the patient today. Risks, benefits, and typical effectiveness rates were reviewed.  Questions were answered.  Written information was also given to the patient to review.    The patient will follow up in  1 years for surveillance.  The patient was told to call with any further questions, or with any concerns about this method of contraception.  Emphasized use of condoms 100% of the time for STI prevention.  Emergency Contraception Precautions (ECP): Patient assessed for need of ECP. She is not a candidate based on LARC in place and unexpired .   2. Encounter for removal and reinsertion of Nexplanon   - etonogestrel  (NEXPLANON ) implant 68 mg   Procedure:  Nexplanon   Removal and Insertion  Patient identified, informed consent performed, consent signed.   Patient does understand that irregular bleeding is a very common side effect of this medication. She was advised to have backup contraception for one week after replacement of the implant. Patient deemed to meet WHO criteria for being reasonably certain she is not pregnant.  Appropriate time out taken. Nexplanon  site identified in the patient's right arm. Area prepped in usual sterile fashon. 2.5 ml of 1% lidocaine with epinephrine was used to anesthetize the area at the distal end of the implant due to patient requiring more anesthesia upon testing site for numbness. A small stab incision was made right beside the implant on the distal portion. The Nexplanon  rod was grasped using hemostats and removed without difficulty. There was minimal blood loss. There were no complications.   Confirmed correct location of insertion site in the left arm. The insertion site was identified 8-10 cm (3-4 inches) from the medial epicondyle of the humerus and 3-5 cm (1.25-2 inches) posterior to (below) the sulcus (groove) between the biceps and triceps muscles of the patient's left arm. 3 ml of 1% lidocaine was used to anesthetize the area along implant insertion site. New Nexplanon  removed from packaging, device confirmed in needle, then inserted full length of needle and withdrawn per handbook instructions. Nexplanon  was able to palpated in the patient's arm; patient palpated the insert herself.  There was minimal blood loss. Patient insertion site covered with guaze and a pressure bandage to reduce any bruising. The patient tolerated the procedure well and was given post procedure instructions.     Nexplanon :   Counseled patient to take OTC analgesic starting as soon as lidocaine starts to wear off and take regularly for at least 48 hr to decrease discomfort.  Specifically to take with food or milk to decrease stomach upset and for IB  600 mg (3 tablets) every 6 hrs; IB 800 mg (4 tablets) every 8 hrs; or Aleve 2 tablets every 12 hrs.   Return when able for pap test.  Damien FORBES Satchel, NP "
